# Patient Record
Sex: Female | Born: 1990 | Hispanic: No | Marital: Single | Smoking: Never smoker
Health system: Southern US, Community
[De-identification: ages and names within clinical notes are randomized; demographics above are authoritative.]

## PROBLEM LIST (undated history)

## (undated) DIAGNOSIS — F419 Anxiety disorder, unspecified: Secondary | ICD-10-CM

---

## 2012-04-28 ENCOUNTER — Emergency Department (HOSPITAL_COMMUNITY): Payer: Self-pay

## 2012-04-28 ENCOUNTER — Emergency Department (HOSPITAL_COMMUNITY)
Admission: EM | Admit: 2012-04-28 | Discharge: 2012-04-28 | Disposition: A | Discharge status: Home / Self Care | Payer: Self-pay | Attending: Emergency Medicine | Admitting: Emergency Medicine

## 2012-04-28 ENCOUNTER — Encounter (HOSPITAL_COMMUNITY): Payer: Self-pay | Admitting: Emergency Medicine

## 2012-04-28 DIAGNOSIS — Z8659 Personal history of other mental and behavioral disorders: Secondary | ICD-10-CM | POA: Insufficient documentation

## 2012-04-28 DIAGNOSIS — R112 Nausea with vomiting, unspecified: Secondary | ICD-10-CM

## 2012-04-28 DIAGNOSIS — R059 Cough, unspecified: Secondary | ICD-10-CM | POA: Insufficient documentation

## 2012-04-28 DIAGNOSIS — Z3490 Encounter for supervision of normal pregnancy, unspecified, unspecified trimester: Secondary | ICD-10-CM

## 2012-04-28 DIAGNOSIS — R0602 Shortness of breath: Secondary | ICD-10-CM | POA: Insufficient documentation

## 2012-04-28 DIAGNOSIS — R0789 Other chest pain: Secondary | ICD-10-CM | POA: Insufficient documentation

## 2012-04-28 DIAGNOSIS — O219 Vomiting of pregnancy, unspecified: Secondary | ICD-10-CM | POA: Insufficient documentation

## 2012-04-28 DIAGNOSIS — R05 Cough: Secondary | ICD-10-CM

## 2012-04-28 HISTORY — DX: Anxiety disorder, unspecified: F41.9

## 2012-04-28 LAB — URINALYSIS, ROUTINE W REFLEX MICROSCOPIC
Bilirubin Urine: NEGATIVE
Nitrite: NEGATIVE
Specific Gravity, Urine: 1.015 (ref 1.005–1.030)
Urobilinogen, UA: 0.2 mg/dL (ref 0.0–1.0)
pH: 8 (ref 5.0–8.0)

## 2012-04-28 LAB — CBC WITH DIFFERENTIAL/PLATELET
Eosinophils Absolute: 0.2 10*3/uL (ref 0.0–0.7)
Eosinophils Relative: 2 % (ref 0–5)
HCT: 41.9 % (ref 36.0–46.0)
Lymphocytes Relative: 12 % (ref 12–46)
Lymphs Abs: 1.5 10*3/uL (ref 0.7–4.0)
MCH: 28.9 pg (ref 26.0–34.0)
MCV: 83.6 fL (ref 78.0–100.0)
Monocytes Absolute: 0.9 10*3/uL (ref 0.1–1.0)
Monocytes Relative: 7 % (ref 3–12)
Platelets: 276 10*3/uL (ref 150–400)
RBC: 5.01 MIL/uL (ref 3.87–5.11)
WBC: 13.1 10*3/uL — ABNORMAL HIGH (ref 4.0–10.5)

## 2012-04-28 LAB — D-DIMER, QUANTITATIVE: D-Dimer, Quant: 0.31 ug/mL-FEU (ref 0.00–0.48)

## 2012-04-28 LAB — URINE MICROSCOPIC-ADD ON

## 2012-04-28 LAB — COMPREHENSIVE METABOLIC PANEL
ALT: 9 U/L (ref 0–35)
BUN: 6 mg/dL (ref 6–23)
CO2: 26 mEq/L (ref 19–32)
Calcium: 10 mg/dL (ref 8.4–10.5)
Creatinine, Ser: 0.45 mg/dL — ABNORMAL LOW (ref 0.50–1.10)
GFR calc Af Amer: >90 mL/min (ref >90)
GFR calc non Af Amer: >90 mL/min (ref >90)
Glucose, Bld: 84 mg/dL (ref 70–99)
Total Protein: 8.3 g/dL (ref 6.0–8.3)

## 2012-04-28 LAB — POCT I-STAT TROPONIN I

## 2012-04-28 MED ORDER — ALBUTEROL SULFATE (5 MG/ML) 0.5% IN NEBU
5.0000 mg | INHALATION_SOLUTION | Freq: Once | RESPIRATORY_TRACT | Status: AC
  Administered 2012-04-28: 5 mg via RESPIRATORY_TRACT
  Filled 2012-04-28: qty 1

## 2012-04-28 MED ORDER — ALBUTEROL SULFATE HFA 108 (90 BASE) MCG/ACT IN AERS
2.0000 | INHALATION_SPRAY | Freq: Once | RESPIRATORY_TRACT | Status: AC
  Administered 2012-04-28: 2 via RESPIRATORY_TRACT
  Filled 2012-04-28: qty 6.7

## 2012-04-28 MED ORDER — ONDANSETRON 4 MG PO TBDP
4.0000 mg | ORAL_TABLET | Freq: Once | ORAL | Status: AC
  Administered 2012-04-28: 4 mg via ORAL
  Filled 2012-04-28: qty 1

## 2012-04-28 MED ORDER — CONCEPT OB 130-92.4-1 MG PO CAPS: 1.0000 | ORAL_CAPSULE | Freq: Every day | ORAL | Status: DC

## 2012-04-28 MED ORDER — ONDANSETRON HCL 4 MG PO TABS: 4.0000 mg | ORAL_TABLET | Freq: Four times a day (QID) | ORAL | Status: DC

## 2012-04-28 NOTE — ED Notes (Signed)
Chest pain  midsternal heavy x  3-4 days some sob vomiting a lot had some blood in it has not seen a dr for her preg she states she is 9- 10 weeks

## 2012-04-28 NOTE — ED Provider Notes (Signed)
History     CSN: [id number]  Arrival date & time 04/28/12  1149   First MD Initiated Contact with Patient 04/28/12 1433      No chief complaint on file.   (Consider location/radiation/quality/duration/timing/severity/associated sxs/prior treatment) The history is provided by the patient and medical records.    Morgan Schneider is a 21 y.o. female presents to the emergency department c/o CP, SOB.  Pt states she is [redacted] weeks pregnant, has had no prenatal care and is not taking a prenatal vitamin.  Patient states the symptoms began gradually, but persistent progressively worsening. She is complaining of chest pain described as heavy like pressure, located in the midsternal region, rated at a 4/10, nonradiating. Patient has associated shortness of breath, cough and vomiting. She states that she has been vomiting gastric contents and one episode was bloody but has otherwise been nonbilious and nonbloody. Nothing makes the chest pain better and nothing makes the chest pain worse. She has tried nothing for the pain. She states she has generally kept her nausea under control until she tries to eat. She denies coryza, sinus pressure, sore throat, mucus production with her cough, hemoptysis, diarrhea, weakness, dizziness, syncope.   Past Medical History  Diagnosis Date  . Anxiety     No past surgical history on file.  No family history on file.  History  Substance Use Topics  . Smoking status: Never Smoker   . Smokeless tobacco: Not on file  . Alcohol Use: No    OB History    Grav Para Term Preterm Abortions TAB SAB Ect Mult Living   1               Review of Systems  Constitutional: Negative for fever, diaphoresis, appetite change, fatigue and unexpected weight change.  HENT: Negative for congestion, sore throat, rhinorrhea, mouth sores, neck pain, neck stiffness, postnasal drip and sinus pressure.   Eyes: Negative for visual disturbance.  Respiratory: Positive for cough and shortness  of breath. Negative for chest tightness and wheezing.   Cardiovascular: Positive for chest pain.  Gastrointestinal: Positive for nausea and vomiting. Negative for abdominal pain, diarrhea and constipation.  Genitourinary: Negative for dysuria, urgency, frequency and hematuria.  Musculoskeletal: Negative for back pain and arthralgias.  Skin: Negative for rash.  Neurological: Negative for syncope, light-headedness and headaches.  Psychiatric/Behavioral: Negative for sleep disturbance. The patient is not nervous/anxious.   All other systems reviewed and are negative.    Allergies  Review of patient's allergies indicates no known allergies.  Home Medications   Current Outpatient Rx  Name  Route  Sig  Dispense  Refill  . ONDANSETRON HCL 4 MG PO TABS   Oral   Take 1 tablet (4 mg total) by mouth every 6 (six) hours.   12 tablet   0   . CONCEPT OB 130-92.4-1 MG PO CAPS   Oral   Take 1 capsule by mouth daily.   30 capsule   12     BP 123/88  Pulse 96  Temp 97.9 F (36.6 C) (Oral)  Resp 16  SpO2 100%  Physical Exam  Nursing note and vitals reviewed. Constitutional: She is oriented to person, place, and time. She appears well-developed and well-nourished. No distress.  HENT:  Head: Normocephalic and atraumatic.  Mouth/Throat: Oropharynx is clear and moist. No oropharyngeal exudate.  Eyes: Conjunctivae normal and EOM are normal. Pupils are equal, round, and reactive to light. No scleral icterus.  Neck: Normal range of motion. Neck supple.  Cardiovascular: Regular rhythm and intact distal pulses.  Tachycardia present.   Murmur heard.  Systolic murmur is present with a grade of 3/6  Pulses:      Carotid pulses are 2+ on the right side, and 2+ on the left side.      Radial pulses are 2+ on the right side, and 2+ on the left side.       Dorsalis pedis pulses are 2+ on the right side, and 2+ on the left side.       Posterior tibial pulses are 2+ on the right side, and 2+ on the  left side.  Pulmonary/Chest: Effort normal and breath sounds normal. No respiratory distress. She has no wheezes. She has no rales. She exhibits no tenderness.  Abdominal: Soft. Bowel sounds are normal. She exhibits no distension and no mass. There is no tenderness. There is no rebound and no guarding.  Musculoskeletal: Normal range of motion. She exhibits no edema.  Lymphadenopathy:    She has no cervical adenopathy.  Neurological: She is alert and oriented to person, place, and time. She exhibits normal muscle tone. Coordination normal.       Speech is clear and goal oriented Moves extremities without ataxia  Skin: Skin is warm and dry. No rash noted. She is not diaphoretic.  Psychiatric: She has a normal mood and affect.    ED Course  Procedures (including critical care time)  Labs Reviewed  CBC WITH DIFFERENTIAL - Abnormal; Notable for the following:    WBC 13.1 (*)     Neutrophils Relative 80 (*)     Neutro Abs 10.4 (*)     All other components within normal limits  COMPREHENSIVE METABOLIC PANEL - Abnormal; Notable for the following:    Creatinine, Ser 0.45 (*)     All other components within normal limits  URINALYSIS, ROUTINE W REFLEX MICROSCOPIC - Abnormal; Notable for the following:    APPearance CLOUDY (*)     Ketones, ur 40 (*)     Leukocytes, UA MODERATE (*)     All other components within normal limits  URINE MICROSCOPIC-ADD ON - Abnormal; Notable for the following:    Squamous Epithelial / LPF MANY (*)     Bacteria, UA MANY (*)     All other components within normal limits  POCT I-STAT TROPONIN I  D-DIMER, QUANTITATIVE  URINE CULTURE   Dg Chest 2 View  04/28/2012  *RADIOLOGY REPORT*  Clinical Data: Cough, shortness of breath.  CHEST - 2 VIEW  Comparison: None.  Findings: Cardiomediastinal silhouette appears normal.  No acute pulmonary disease is noted.  Bony thorax is intact.  IMPRESSION: No acute cardiopulmonary abnormality seen.   Original Report Authenticated  By: Lupita Raider.,  M.D.    Results for orders placed during the hospital encounter of 04/28/12  CBC WITH DIFFERENTIAL      Component Value Range   WBC 13.1 (*) 4.0 - 10.5 K/uL   RBC 5.01  3.87 - 5.11 MIL/uL   Hemoglobin 14.5  12.0 - 15.0 g/dL   HCT 16.1  09.6 - 04.5 %   MCV 83.6  78.0 - 100.0 fL   MCH 28.9  26.0 - 34.0 pg   MCHC 34.6  30.0 - 36.0 g/dL   RDW 40.9  81.1 - 91.4 %   Platelets 276  150 - 400 K/uL   Neutrophils Relative 80 (*) 43 - 77 %   Neutro Abs 10.4 (*) 1.7 - 7.7 K/uL   Lymphocytes  Relative 12  12 - 46 %   Lymphs Abs 1.5  0.7 - 4.0 K/uL   Monocytes Relative 7  3 - 12 %   Monocytes Absolute 0.9  0.1 - 1.0 K/uL   Eosinophils Relative 2  0 - 5 %   Eosinophils Absolute 0.2  0.0 - 0.7 K/uL   Basophils Relative 0  0 - 1 %   Basophils Absolute 0.0  0.0 - 0.1 K/uL  COMPREHENSIVE METABOLIC PANEL      Component Value Range   Sodium 137  135 - 145 mEq/L   Potassium 4.3  3.5 - 5.1 mEq/L   Chloride 99  96 - 112 mEq/L   CO2 26  19 - 32 mEq/L   Glucose, Bld 84  70 - 99 mg/dL   BUN 6  6 - 23 mg/dL   Creatinine, Ser 8.65 (*) 0.50 - 1.10 mg/dL   Calcium 78.4  8.4 - 69.6 mg/dL   Total Protein 8.3  6.0 - 8.3 g/dL   Albumin 4.6  3.5 - 5.2 g/dL   AST 16  0 - 37 U/L   ALT 9  0 - 35 U/L   Alkaline Phosphatase 44  39 - 117 U/L   Total Bilirubin 0.4  0.3 - 1.2 mg/dL   GFR calc non Af Amer >90  >90 mL/min   GFR calc Af Amer >90  >90 mL/min  URINALYSIS, ROUTINE W REFLEX MICROSCOPIC      Component Value Range   Color, Urine YELLOW  YELLOW   APPearance CLOUDY (*) CLEAR   Specific Gravity, Urine 1.015  1.005 - 1.030   pH 8.0  5.0 - 8.0   Glucose, UA NEGATIVE  NEGATIVE mg/dL   Hgb urine dipstick NEGATIVE  NEGATIVE   Bilirubin Urine NEGATIVE  NEGATIVE   Ketones, ur 40 (*) NEGATIVE mg/dL   Protein, ur NEGATIVE  NEGATIVE mg/dL   Urobilinogen, UA 0.2  0.0 - 1.0 mg/dL   Nitrite NEGATIVE  NEGATIVE   Leukocytes, UA MODERATE (*) NEGATIVE  POCT I-STAT TROPONIN I      Component  Value Range   Troponin i, poc 0.00  0.00 - 0.08 ng/mL   Comment 3           URINE MICROSCOPIC-ADD ON      Component Value Range   Squamous Epithelial / LPF MANY (*) RARE   WBC, UA 3-6  <3 WBC/hpf   Bacteria, UA MANY (*) RARE   Urine-Other AMORPHOUS URATES/PHOSPHATES    D-DIMER, QUANTITATIVE      Component Value Range   D-Dimer, Quant 0.31  0.00 - 0.48 ug/mL-FEU   Dg Chest 2 View  04/28/2012  *RADIOLOGY REPORT*  Clinical Data: Cough, shortness of breath.  CHEST - 2 VIEW  Comparison: None.  Findings: Cardiomediastinal silhouette appears normal.  No acute pulmonary disease is noted.  Bony thorax is intact.  IMPRESSION: No acute cardiopulmonary abnormality seen.   Original Report Authenticated By: Lupita Raider.,  M.D.       1. Nausea & vomiting   2. Pregnancy   3. Cough       MDM  Guida Asman presents to the emergency department complaining of chest pain and shortness of breath.  She is [redacted] weeks pregnant.  Urine with contamination, urine culture sent, patient without dysuria or other urinary symptoms.  CMP unremarkable, troponin negative, CBC with mild leukocytosis.  Concern for her bronchitis versus pneumonia versus PE. D-dimer negative, chest x-ray without evidence of effusion or  infiltrate.  Patient given albuterol treatment with significant improvement in chest pain and shortness of breath.  I discussed these findings with the patient. I also discussed the importance of followup and prenatal care. Patient will be prescribed a when necessary inhaler, Zofran for nausea and prenatal vitamins.  I have also discussed reasons to return immediately to the ER.  Patient expresses understanding and agrees with plan.   1. Medications: Albuterol inhaler, prenatal vitamins, Zofran  2. Treatment: Rest, drink plenty of fluids, use inhaler when you feel short of breath, take Zofran when you feel nausea, take prenatal vitamins every day as prescribed  3. Follow Up: With [facility] or  [facility] as discussed with Sunny Schlein, prenatal care is very important to your health and your baby        Dierdre Forth, PA-C 04/28/12 2245

## 2012-04-28 NOTE — ED Notes (Signed)
Patient states she has a dry cough that causes her to acid reflux. She has sharp pains in her chest that she says makes it hard to breath. She is currently pregnant and has no OBGYN.

## 2012-04-29 LAB — URINE CULTURE
Colony Count: NO GROWTH
Culture: NO GROWTH

## 2012-04-30 NOTE — ED Provider Notes (Signed)
Medical screening examination/treatment/procedure(s) were performed by non-physician practitioner and as supervising physician I was immediately available for consultation/collaboration.   Lyanne Co, MD 04/30/12 [phone]

## 2013-09-25 IMAGING — CR DG CHEST 2V
2 series · 2 of 2 positions shown · non-contrast
Comparison: None.

CLINICAL DATA: Cough, shortness of breath.

CHEST - 2 VIEW

[w chest pa]
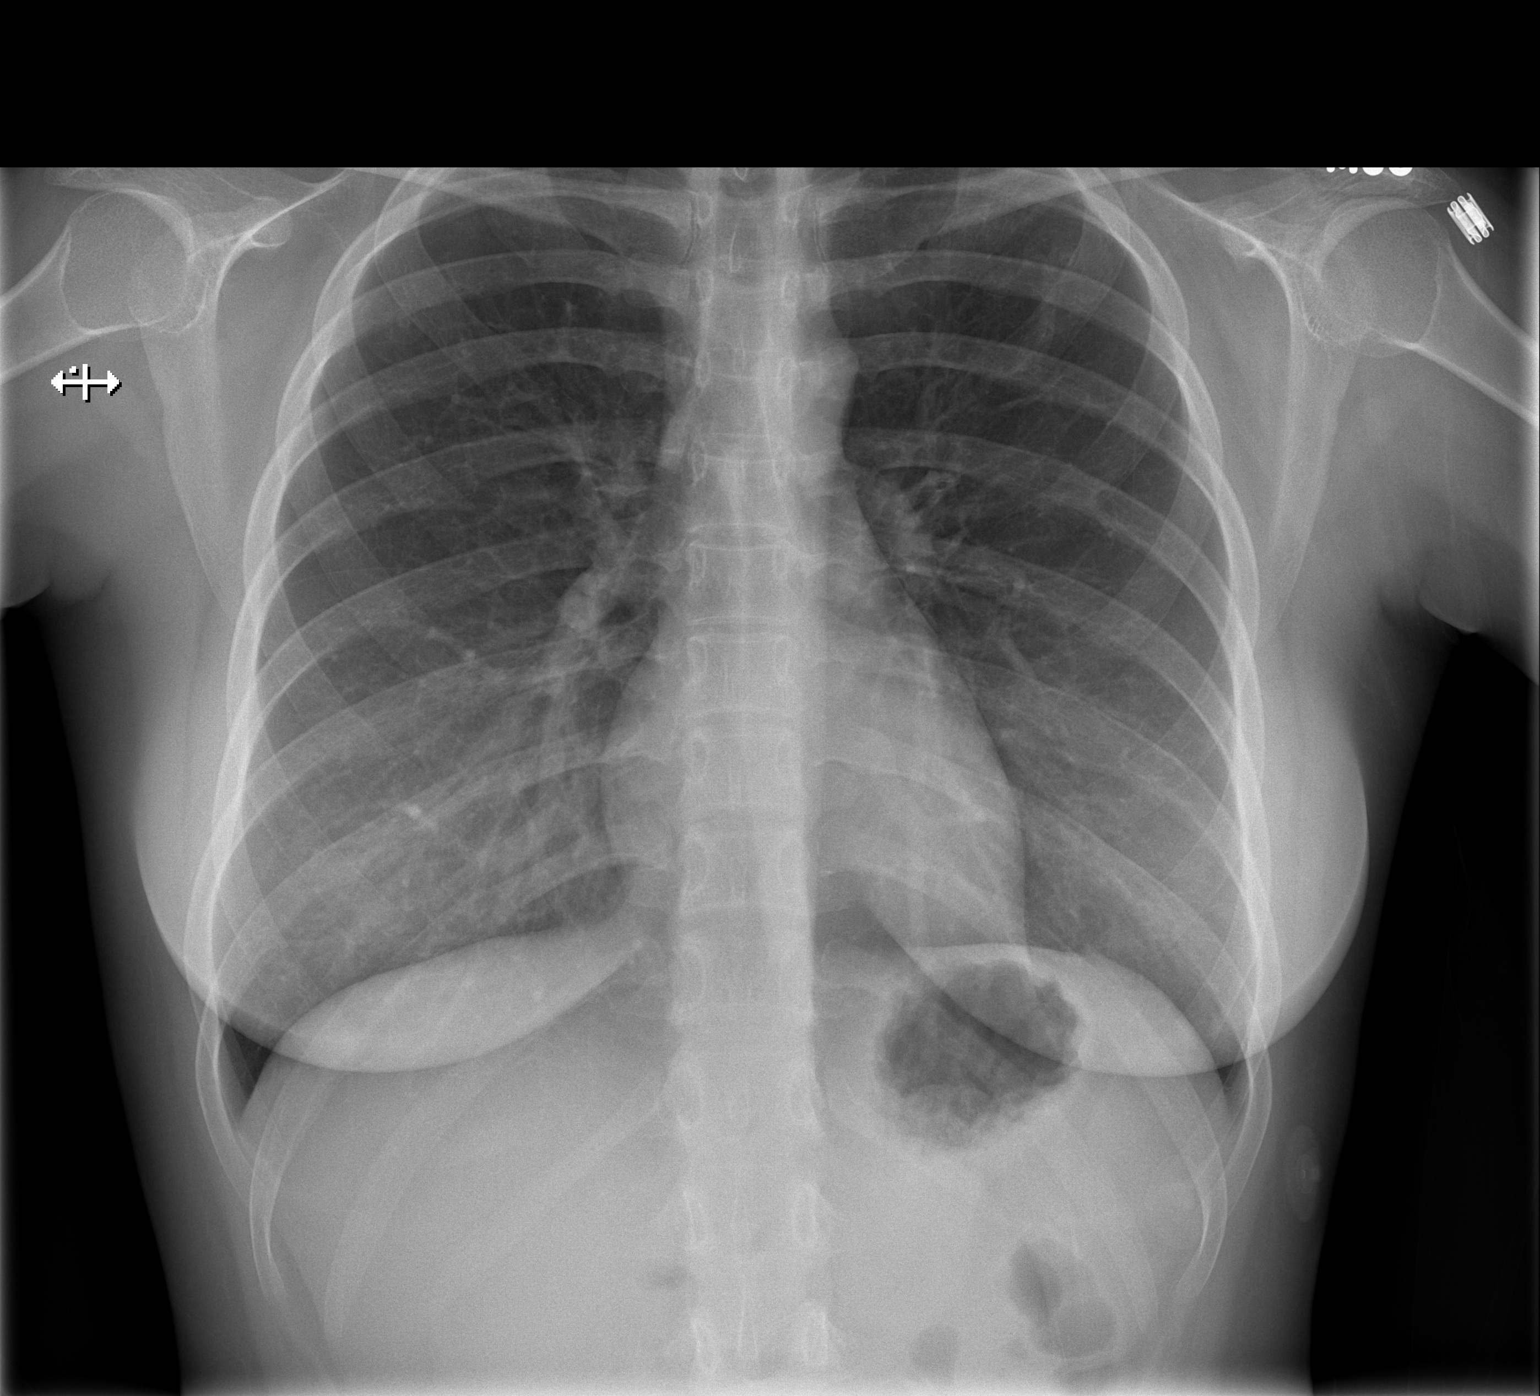

[w chest lat]
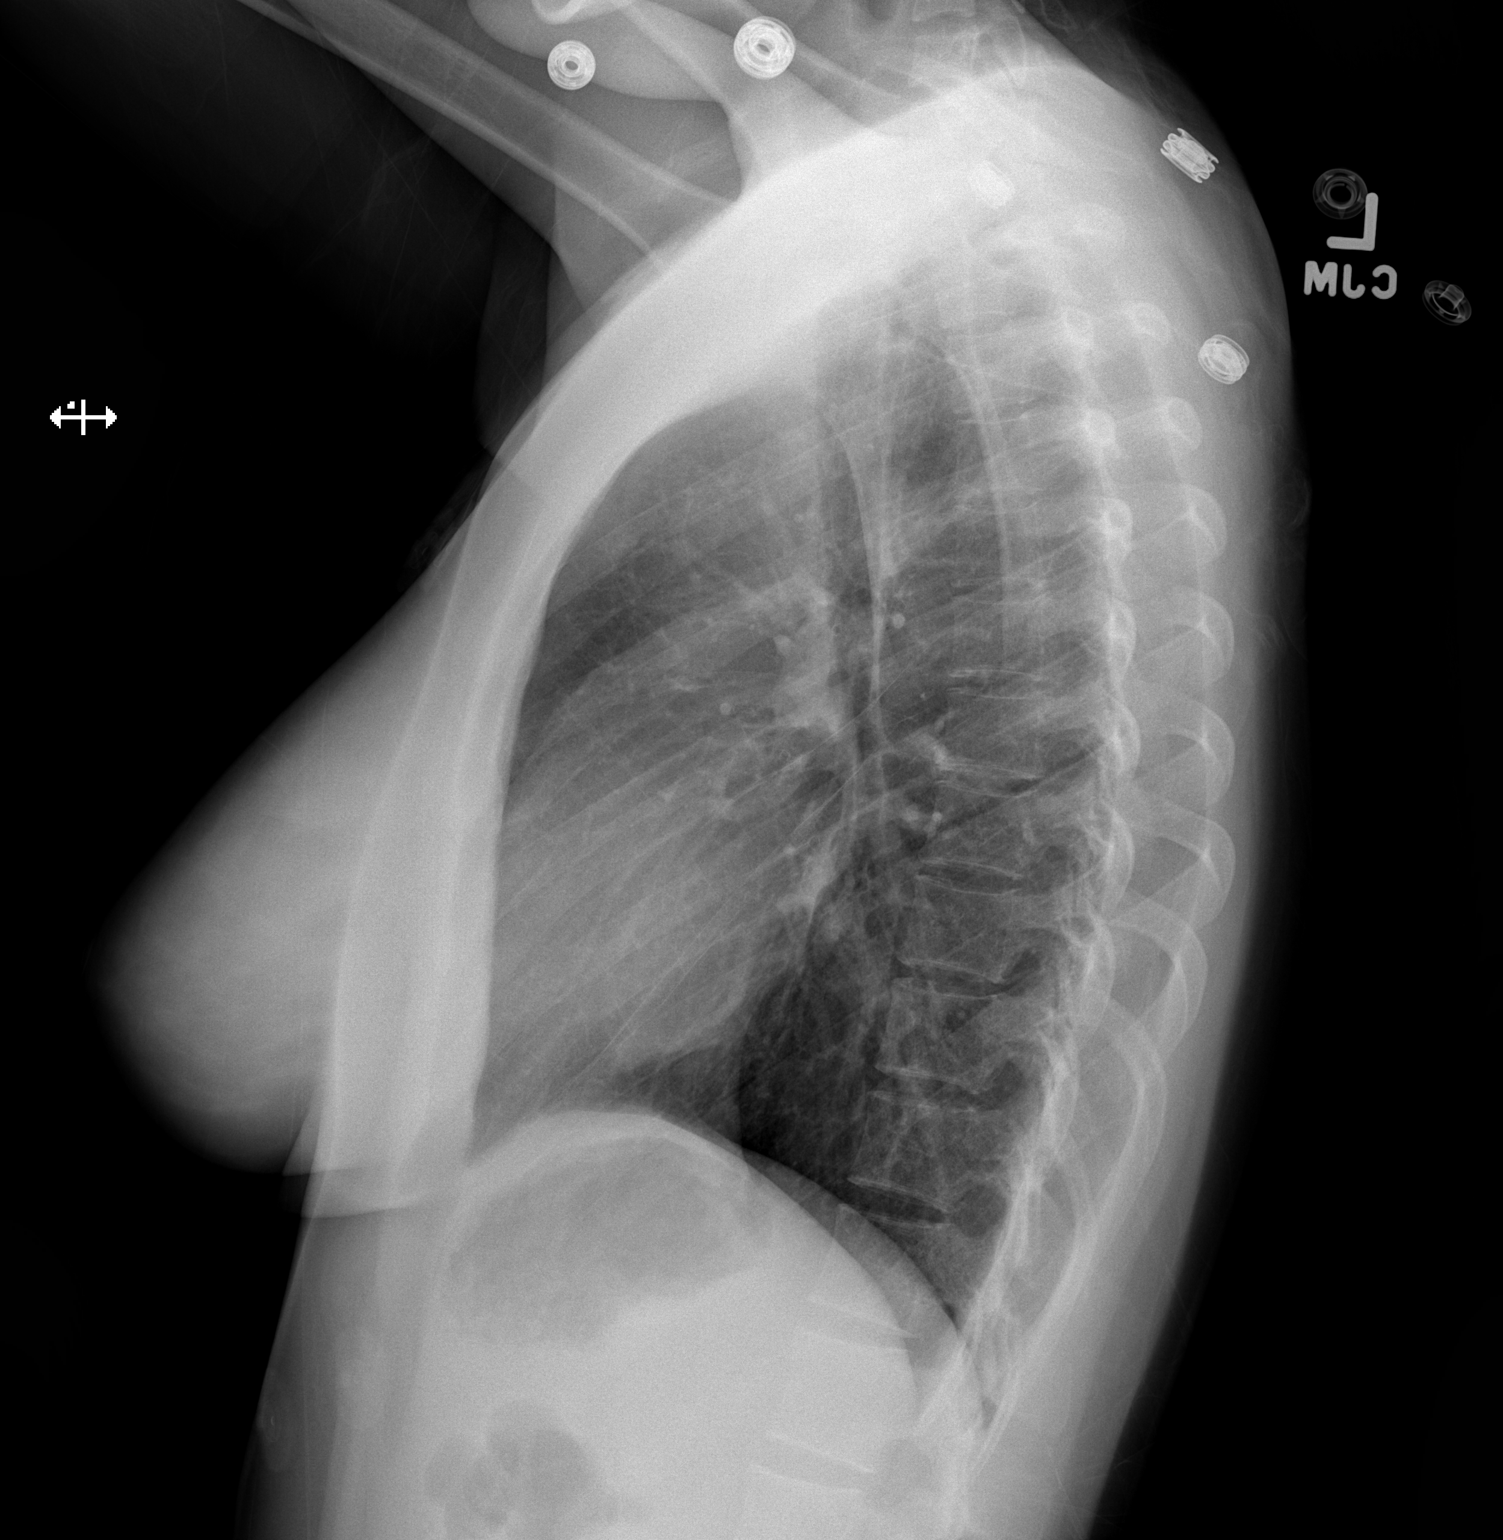

[2 of 2 positions shown; findings below may reference images not displayed]

FINDINGS: Cardiomediastinal silhouette appears normal.  No acute
pulmonary disease is noted.  Bony thorax is intact.
IMPRESSION: No acute cardiopulmonary abnormality seen.

## 2014-01-17 ENCOUNTER — Emergency Department (HOSPITAL_COMMUNITY): Payer: BC Managed Care – PPO

## 2014-01-17 ENCOUNTER — Encounter (HOSPITAL_COMMUNITY): Payer: Self-pay | Admitting: Emergency Medicine

## 2014-01-17 ENCOUNTER — Emergency Department (HOSPITAL_COMMUNITY)
Admission: EM | Admit: 2014-01-17 | Discharge: 2014-01-17 | Disposition: A | Discharge status: Home / Self Care | Payer: BC Managed Care – PPO | Attending: Emergency Medicine | Admitting: Emergency Medicine

## 2014-01-17 DIAGNOSIS — Z8659 Personal history of other mental and behavioral disorders: Secondary | ICD-10-CM | POA: Diagnosis not present

## 2014-01-17 DIAGNOSIS — R1032 Left lower quadrant pain: Secondary | ICD-10-CM | POA: Insufficient documentation

## 2014-01-17 DIAGNOSIS — Z791 Long term (current) use of non-steroidal anti-inflammatories (NSAID): Secondary | ICD-10-CM | POA: Insufficient documentation

## 2014-01-17 DIAGNOSIS — Z3202 Encounter for pregnancy test, result negative: Secondary | ICD-10-CM | POA: Insufficient documentation

## 2014-01-17 DIAGNOSIS — N739 Female pelvic inflammatory disease, unspecified: Secondary | ICD-10-CM | POA: Insufficient documentation

## 2014-01-17 DIAGNOSIS — N730 Acute parametritis and pelvic cellulitis: Secondary | ICD-10-CM

## 2014-01-17 DIAGNOSIS — Z79899 Other long term (current) drug therapy: Secondary | ICD-10-CM | POA: Insufficient documentation

## 2014-01-17 LAB — CBC WITH DIFFERENTIAL/PLATELET
Basophils Absolute: 0 10*3/uL (ref 0.0–0.1)
Basophils Relative: 0 % (ref 0–1)
EOS ABS: 0.4 10*3/uL (ref 0.0–0.7)
Eosinophils Relative: 4 % (ref 0–5)
HCT: 37 % (ref 36.0–46.0)
HEMOGLOBIN: 11.7 g/dL — AB (ref 12.0–15.0)
LYMPHS ABS: 1.8 10*3/uL (ref 0.7–4.0)
LYMPHS PCT: 20 % (ref 12–46)
MCH: 23.7 pg — AB (ref 26.0–34.0)
MCHC: 31.6 g/dL (ref 30.0–36.0)
MCV: 75.1 fL — ABNORMAL LOW (ref 78.0–100.0)
MONOS PCT: 6 % (ref 3–12)
Monocytes Absolute: 0.5 10*3/uL (ref 0.1–1.0)
NEUTROS ABS: 6.3 10*3/uL (ref 1.7–7.7)
NEUTROS PCT: 70 % (ref 43–77)
Platelets: 332 10*3/uL (ref 150–400)
RBC: 4.93 MIL/uL (ref 3.87–5.11)
RDW: 14.2 % (ref 11.5–15.5)
WBC: 9 10*3/uL (ref 4.0–10.5)

## 2014-01-17 LAB — COMPREHENSIVE METABOLIC PANEL
ALK PHOS: 50 U/L (ref 39–117)
ALT: 9 U/L (ref 0–35)
ANION GAP: 14 (ref 5–15)
AST: 18 U/L (ref 0–37)
Albumin: 4.4 g/dL (ref 3.5–5.2)
BILIRUBIN TOTAL: 0.3 mg/dL (ref 0.3–1.2)
BUN: 10 mg/dL (ref 6–23)
CHLORIDE: 101 meq/L (ref 96–112)
CO2: 22 meq/L (ref 19–32)
Calcium: 9.5 mg/dL (ref 8.4–10.5)
Creatinine, Ser: 0.56 mg/dL (ref 0.50–1.10)
GLUCOSE: 91 mg/dL (ref 70–99)
POTASSIUM: 3.8 meq/L (ref 3.7–5.3)
Sodium: 137 mEq/L (ref 137–147)
Total Protein: 8.1 g/dL (ref 6.0–8.3)

## 2014-01-17 LAB — URINALYSIS, ROUTINE W REFLEX MICROSCOPIC
BILIRUBIN URINE: NEGATIVE
Glucose, UA: NEGATIVE mg/dL
HGB URINE DIPSTICK: NEGATIVE
KETONES UR: 15 mg/dL — AB
NITRITE: NEGATIVE
Protein, ur: NEGATIVE mg/dL
Specific Gravity, Urine: 1.008 (ref 1.005–1.030)
UROBILINOGEN UA: 0.2 mg/dL (ref 0.0–1.0)
pH: 5.5 (ref 5.0–8.0)

## 2014-01-17 LAB — URINE MICROSCOPIC-ADD ON

## 2014-01-17 LAB — POC URINE PREG, ED: Preg Test, Ur: NEGATIVE

## 2014-01-17 LAB — WET PREP, GENITAL
TRICH WET PREP: NONE SEEN
WBC WET PREP: NONE SEEN
Yeast Wet Prep HPF POC: NONE SEEN

## 2014-01-17 MED ORDER — DOXYCYCLINE HYCLATE 100 MG PO CAPS: 100.0000 mg | ORAL_CAPSULE | Freq: Two times a day (BID) | ORAL | Status: AC

## 2014-01-17 MED ORDER — METRONIDAZOLE 500 MG PO TABS: 500.0000 mg | ORAL_TABLET | Freq: Two times a day (BID) | ORAL | Status: AC

## 2014-01-17 MED ORDER — SODIUM CHLORIDE 0.9 % IV BOLUS (SEPSIS)
1000.0000 mL | Freq: Once | INTRAVENOUS | Status: AC
  Administered 2014-01-17: 1000 mL via INTRAVENOUS

## 2014-01-17 MED ORDER — STERILE WATER FOR INJECTION IJ SOLN
INTRAMUSCULAR | Status: AC
  Administered 2014-01-17: 1 mL
  Filled 2014-01-17: qty 10

## 2014-01-17 MED ORDER — DOXYCYCLINE HYCLATE 100 MG PO TABS
100.0000 mg | ORAL_TABLET | Freq: Once | ORAL | Status: AC
  Administered 2014-01-17: 100 mg via ORAL
  Filled 2014-01-17: qty 1

## 2014-01-17 MED ORDER — DOXYCYCLINE HYCLATE 100 MG PO CAPS
100.0000 mg | ORAL_CAPSULE | Freq: Two times a day (BID) | ORAL | Status: DC
Start: 2014-01-17 — End: 2014-01-17

## 2014-01-17 MED ORDER — CEFTRIAXONE SODIUM 250 MG IJ SOLR
250.0000 mg | Freq: Once | INTRAMUSCULAR | Status: AC
  Administered 2014-01-17: 250 mg via INTRAMUSCULAR
  Filled 2014-01-17: qty 250

## 2014-01-17 NOTE — ED Provider Notes (Signed)
CSN: [id number]     Arrival date & time 01/17/14  1400 History   First MD Initiated Contact with Patient 01/17/14 1457     Chief Complaint  Patient presents with  . Abdominal Pain     (Consider location/radiation/quality/duration/timing/severity/associated sxs/prior Treatment) Patient is a 23 y.o. female presenting with abdominal pain.  Abdominal Pain Pain location:  LLQ Pain quality: aching   Pain radiates to:  Does not radiate Pain severity:  Moderate Onset quality:  Gradual Duration:  1 day Timing:  Constant Progression:  Worsening Chronicity:  New Context: not diet changes and not previous surgeries   Relieved by:  Nothing Worsened by:  Palpation, movement and deep breathing Ineffective treatments:  None tried Associated symptoms: no anorexia, no chest pain, no chills, no constipation, no cough, no diarrhea, no dysuria, no fever, no hematuria, no nausea, no shortness of breath, no sore throat, no vaginal bleeding, no vaginal discharge and no vomiting     Past Medical History  Diagnosis Date  . Anxiety    History reviewed. No pertinent past surgical history. History reviewed. No pertinent family history. History  Substance Use Topics  . Smoking status: Never Smoker   . Smokeless tobacco: Not on file  . Alcohol Use: No   OB History   Grav Para Term Preterm Abortions TAB SAB Ect Mult Living   1              Review of Systems  Constitutional: Negative for fever and chills.  HENT: Negative for congestion, rhinorrhea and sore throat.   Eyes: Negative for photophobia and visual disturbance.  Respiratory: Negative for cough and shortness of breath.   Cardiovascular: Negative for chest pain and leg swelling.  Gastrointestinal: Positive for abdominal pain. Negative for nausea, vomiting, diarrhea, constipation and anorexia.  Endocrine: Negative for polyphagia and polyuria.  Genitourinary: Negative for dysuria, hematuria, flank pain, vaginal bleeding, vaginal discharge  and enuresis.  Musculoskeletal: Negative for back pain and gait problem.  Skin: Negative for color change and rash.  Neurological: Negative for dizziness, syncope, light-headedness and numbness.  Hematological: Negative for adenopathy. Does not bruise/bleed easily.  All other systems reviewed and are negative.     Allergies  Review of patient's allergies indicates no known allergies.  Home Medications   Prior to Admission medications   Medication Sig Start Date End Date Taking? Authorizing Provider  ibuprofen (ADVIL,MOTRIN) 200 MG tablet Take 400 mg by mouth every 6 (six) hours as needed for moderate pain.   Yes Historical Provider, MD  Pseudoeph-Doxylamine-DM-APAP (NYQUIL PO) Take 2 tablets by mouth 2 (two) times daily as needed (flu like symptoms).   Yes Historical Provider, MD  doxycycline (VIBRAMYCIN) 100 MG capsule Take 1 capsule (100 mg total) by mouth 2 (two) times daily. 01/17/14   Mirian MoMatthew Gentry, MD  metroNIDAZOLE (FLAGYL) 500 MG tablet Take 1 tablet (500 mg total) by mouth 2 (two) times daily. 01/17/14   Mirian MoMatthew Gentry, MD   BP 113/73  Pulse 107  Temp(Src) 98.8 F (37.1 C) (Oral)  Resp 14  SpO2 100%  LMP 12/20/2013  Breastfeeding? Unknown Physical Exam  Vitals reviewed. Constitutional: She is oriented to person, place, and time. She appears well-developed and well-nourished.  HENT:  Head: Normocephalic and atraumatic.  Right Ear: External ear normal.  Left Ear: External ear normal.  Eyes: Conjunctivae and EOM are normal. Pupils are equal, round, and reactive to light.  Neck: Normal range of motion. Neck supple.  Cardiovascular: Normal rate, regular rhythm, normal heart  sounds and intact distal pulses.   Pulmonary/Chest: Effort normal and breath sounds normal.  Abdominal: Soft. Bowel sounds are normal. There is tenderness in the left lower quadrant.  Genitourinary: Cervix exhibits discharge (purulent) and friability. Cervix exhibits no motion tenderness. Right adnexum  displays no mass, no tenderness and no fullness. Left adnexum displays tenderness. Left adnexum displays no mass and no fullness.  Musculoskeletal: Normal range of motion.  Neurological: She is alert and oriented to person, place, and time.  Skin: Skin is warm and dry.    ED Course  Procedures (including critical care time) Labs Review Labs Reviewed  WET PREP, GENITAL - Abnormal; Notable for the following:    Clue Cells Wet Prep HPF POC FEW (*)    All other components within normal limits  CBC WITH DIFFERENTIAL - Abnormal; Notable for the following:    Hemoglobin 11.7 (*)    MCV 75.1 (*)    MCH 23.7 (*)    All other components within normal limits  URINALYSIS, ROUTINE W REFLEX MICROSCOPIC - Abnormal; Notable for the following:    Ketones, ur 15 (*)    Leukocytes, UA SMALL (*)    All other components within normal limits  GC/CHLAMYDIA PROBE AMP  COMPREHENSIVE METABOLIC PANEL  URINE MICROSCOPIC-ADD ON  POC URINE PREG, ED    Imaging Review US Transvaginal Non-ob  01/17/2014   CLINICAL DATA:  23 year old female with left pelvic pain. Negative pregnancy test.  EXAM: TRANSABDOMINAL AND TRANSVAGINAL ULTRASOUND OF PELVIS  TECHNIQUE: Both transabdominal and transvaginal ultrasound examinations of the pelvis were performed. Transabdominal technique was performed for global imaging of the pelvis including uterus, ovaries, adnexal regions, and pelvic cul-de-sac. It was necessary to proceed with endovaginal exam following the transabdominal exam to visualize the ovaries and endometrium.  COMPARISON:  None  FINDINGS: Uterus  Measurements: 7.4 x 4.3 x 5.1 cm. No fibroids or other mass visualized.  Endometrium  Thickness: 18 mm.  No focal abnormality visualized.  Right ovary  Measurements: 3.4 x 2.3 x 2.2 cm. Normal appearance/no adnexal mass.  Left ovary  Measurements: 3.6 x 2 x 2.2 cm. Normal appearance/no adnexal mass.  Other findings  A small amount free pelvic fluid is noted and may be  physiologic. There is no evidence of adnexal mass.  IMPRESSION: Mildly thickened endometrium measuring 18 mm. Consider follow-up by [place] in 6-8 weeks, during the week immediately following menses (exam timing is critical).  Small amount of free pelvic fluid which may be physiologic.  No other significant abnormalities identified.   Electronically Signed   By: Laveda Abbe M.D.   On: 01/17/2014 16:55   US Pelvis Complete  01/17/2014   CLINICAL DATA:  23 year old female with left pelvic pain. Negative pregnancy test.  EXAM: TRANSABDOMINAL AND TRANSVAGINAL ULTRASOUND OF PELVIS  TECHNIQUE: Both transabdominal and transvaginal ultrasound examinations of the pelvis were performed. Transabdominal technique was performed for global imaging of the pelvis including uterus, ovaries, adnexal regions, and pelvic cul-de-sac. It was necessary to proceed with endovaginal exam following the transabdominal exam to visualize the ovaries and endometrium.  COMPARISON:  None  FINDINGS: Uterus  Measurements: 7.4 x 4.3 x 5.1 cm. No fibroids or other mass visualized.  Endometrium  Thickness: 18 mm.  No focal abnormality visualized.  Right ovary  Measurements: 3.4 x 2.3 x 2.2 cm. Normal appearance/no adnexal mass.  Left ovary  Measurements: 3.6 x 2 x 2.2 cm. Normal appearance/no adnexal mass.  Other findings  A small amount free pelvic fluid is  noted and may be physiologic. There is no evidence of adnexal mass.  IMPRESSION: Mildly thickened endometrium measuring 18 mm. Consider follow-up by [place] in 6-8 weeks, during the week immediately following menses (exam timing is critical).  Small amount of free pelvic fluid which may be physiologic.  No other significant abnormalities identified.   Electronically Signed   By: Laveda Abbe M.D.   On: 01/17/2014 16:55     EKG Interpretation None      MDM   Final diagnoses:  PID (acute pelvic inflammatory disease)    23 y.o. female  without pertinent PMH presents with left lower quadrant abdominal  tenderness and pain as described above. Patient was in her normal state of health until yesterday when she began to have soreness in her left lower quadrant which became increasingly severe. She denies fever, nausea, vomiting, diarrhea, constipation, or any other illness. The patient states that eating does not affect her pain.  On arrival vital signs as above significant for mild tachycardia, which improved during my examination.  Physical exam demonstrated purulent discharge from cervix and cervical friability.  Ultrasound obtained and as above unremarkable for abscess.  Will treat patient for PID and have her followup with her primary care physician.  Standard return precautions for torsion, other abdominal symptoms given, patient voiced understanding and agreed to followup.     Labs and imaging as above reviewed.   1. PID (acute pelvic inflammatory disease)         Mirian Mo, MD 01/18/14 0000

## 2014-01-17 NOTE — ED Notes (Signed)
Pelvic setup at bedside.

## 2014-01-17 NOTE — Discharge Instructions (Signed)
Pelvic Inflammatory Disease °Pelvic inflammatory disease (PID) refers to an infection in some or all of the female organs. The infection can be in the uterus, ovaries, fallopian tubes, or the surrounding tissues in the pelvis. PID can cause abdominal or pelvic pain that comes on suddenly (acute pelvic pain). PID is a serious infection because it can lead to lasting (chronic) pelvic pain or the inability to have children (infertile).  °CAUSES  °The infection is often caused by the normal bacteria found in the vaginal tissues. PID may also be caused by an infection that is spread during sexual contact. PID can also occur following:  °· The birth of a baby.   °· A miscarriage.   °· An abortion.   °· Major pelvic surgery.   °· The use of an intrauterine device (IUD).   °· A sexual assault.   °RISK FACTORS °Certain factors can put a person at higher risk for PID, such as: °· Being younger than 25 years. °· Being sexually active at a young age. °· Using nonbarrier contraception. °· Having multiple sexual partners. °· Having sex with someone who has symptoms of a genital infection. °· Using oral contraception. °Other times, certain behaviors can increase the possibility of getting PID, such as: °· Having sex during your period. °· Using a vaginal douche. °· Having an intrauterine device (IUD) in place. °SYMPTOMS  °· Abdominal or pelvic pain.   °· Fever.   °· Chills.   °· Abnormal vaginal discharge. °· Abnormal uterine bleeding.   °· Unusual pain shortly after finishing your period. °DIAGNOSIS  °Your caregiver will choose some of the following methods to make a diagnosis, such as:  °· Performing a physical exam and history. A pelvic exam typically reveals a very tender uterus and surrounding pelvis.   °· Ordering laboratory tests including a pregnancy test, blood tests, and urine test.  °· Ordering cultures of the vagina and cervix to check for a sexually transmitted infection (STI). °· Performing an ultrasound.    °· Performing a laparoscopic procedure to look inside the pelvis.   °TREATMENT  °· Antibiotic medicines may be prescribed and taken by mouth.   °· Sexual partners may be treated when the infection is caused by a sexually transmitted disease (STD).   °· Hospitalization may be needed to give antibiotics intravenously. °· Surgery may be needed, but this is rare. °It may take weeks until you are completely well. If you are diagnosed with PID, you should also be checked for human immunodeficiency virus (HIV).   °HOME CARE INSTRUCTIONS  °· If given, take your antibiotics as directed. Finish the medicine even if you start to feel better.   °· Only take over-the-counter or prescription medicines for pain, discomfort, or fever as directed by your caregiver.   °· Do not have sexual intercourse until treatment is completed or as directed by your caregiver. If PID is confirmed, your recent sexual partner(s) will need treatment.   °· Keep your follow-up appointments. °SEEK MEDICAL CARE IF:  °· You have increased or abnormal vaginal discharge.   °· You need prescription medicine for your pain.   °· You vomit.   °· You cannot take your medicines.   °· Your partner has an STD.   °SEEK IMMEDIATE MEDICAL CARE IF:  °· You have a fever.   °· You have increased abdominal or pelvic pain.   °· You have chills.   °· You have pain when you urinate.   °· You are not better after 72 hours following treatment.   °MAKE SURE YOU:  °· Understand these instructions. °· Will watch your condition. °· Will get help right away if you are not doing well or get worse. °  Document Released: 05/27/2005 Document Revised: 09/21/2012 Document Reviewed: 05/23/2011 °[employer]® Patient Information ©2015 [employer]. This information is not intended to replace advice given to you by your health care provider. Make sure you discuss any questions you have with your health care provider. ° °

## 2014-01-17 NOTE — ED Notes (Signed)
Pt transported to Ultrasound.  

## 2014-01-17 NOTE — ED Notes (Signed)
Pt states llq abdominal pain since last night.  No n/v.  No diarrhea.  No fever.  No difficulty urinating.

## 2014-01-17 NOTE — ED Notes (Signed)
Per pt, was seen at [facility] this morning.  Pt had pregnancy test completed.  Results negative.

## 2014-01-18 ENCOUNTER — Telehealth (HOSPITAL_BASED_OUTPATIENT_CLINIC_OR_DEPARTMENT_OTHER): Payer: Self-pay | Admitting: Emergency Medicine

## 2014-01-18 LAB — GC/CHLAMYDIA PROBE AMP
CT Probe RNA: NEGATIVE
GC Probe RNA: NEGATIVE

## 2014-04-11 ENCOUNTER — Encounter (HOSPITAL_COMMUNITY): Payer: Self-pay | Admitting: Emergency Medicine

## 2015-06-16 IMAGING — US US PELVIS COMPLETE
1 series · 13 of 25 positions shown · non-contrast
Comparison: None

CLINICAL DATA: 22-year-old female with left pelvic pain. Negative
pregnancy test.

EXAM:
TRANSABDOMINAL AND TRANSVAGINAL ULTRASOUND OF PELVIS
TECHNIQUE: Both transabdominal and transvaginal ultrasound examinations of the
pelvis were performed. Transabdominal technique was performed for
global imaging of the pelvis including uterus, ovaries, adnexal
regions, and pelvic cul-de-sac. It was necessary to proceed with
endovaginal exam following the transabdominal exam to visualize the
ovaries and endometrium.

[Series 1: us pelvis complete · 0.20mm/px · 13 of 87 slices shown]
[im 1/87]
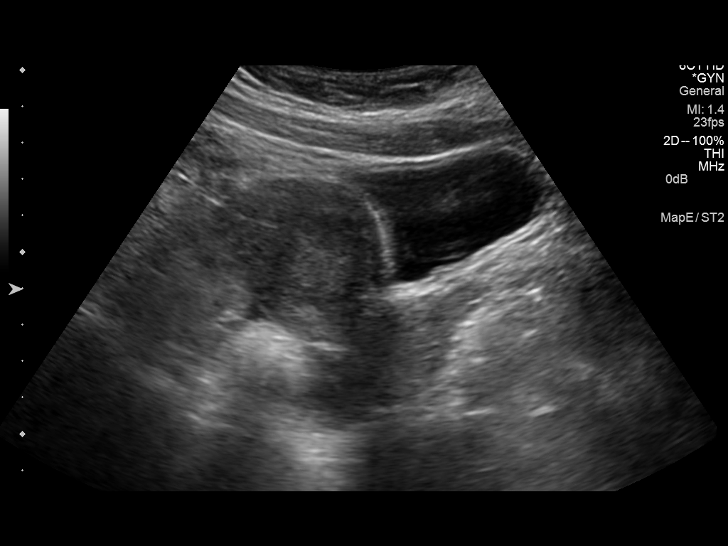
[im 8/87]
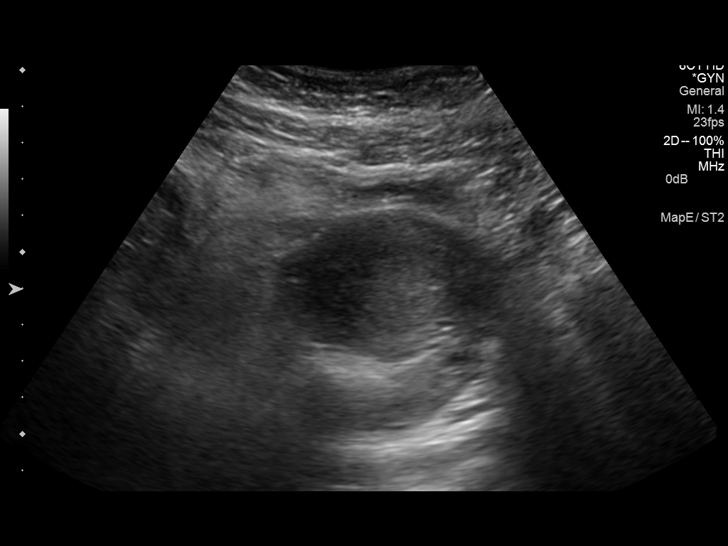
[im 15/87]
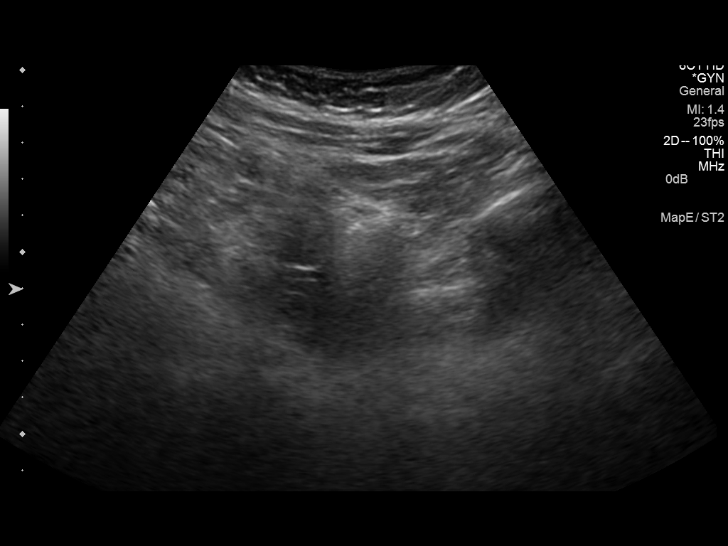
[im 22/87]
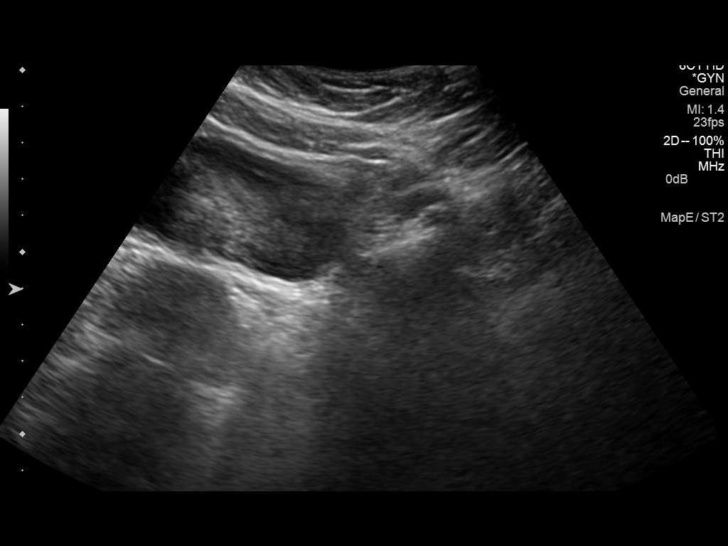
[im 29/87]
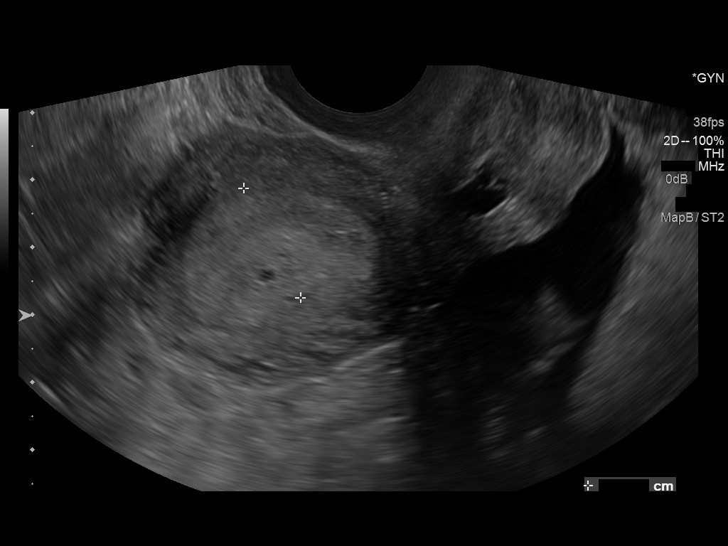
[im 36/87]
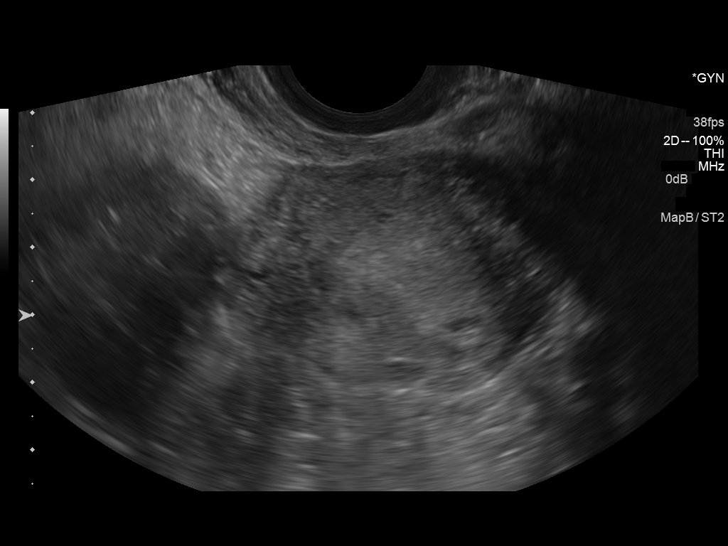
[im 44/87]
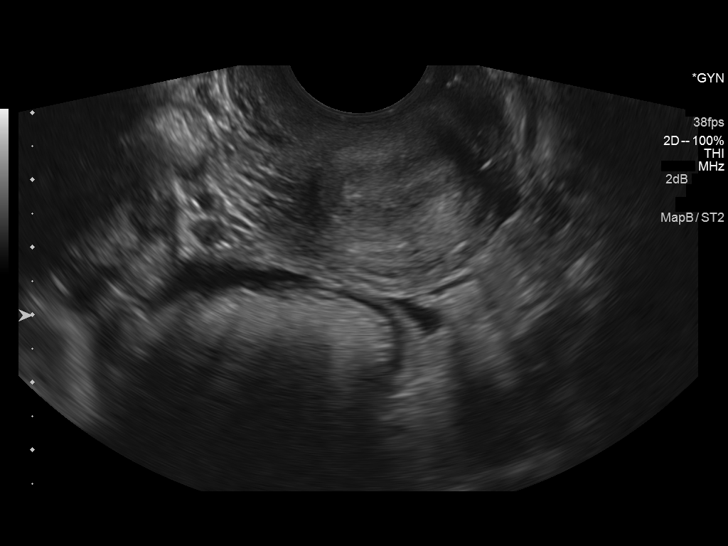
[im 51/87]
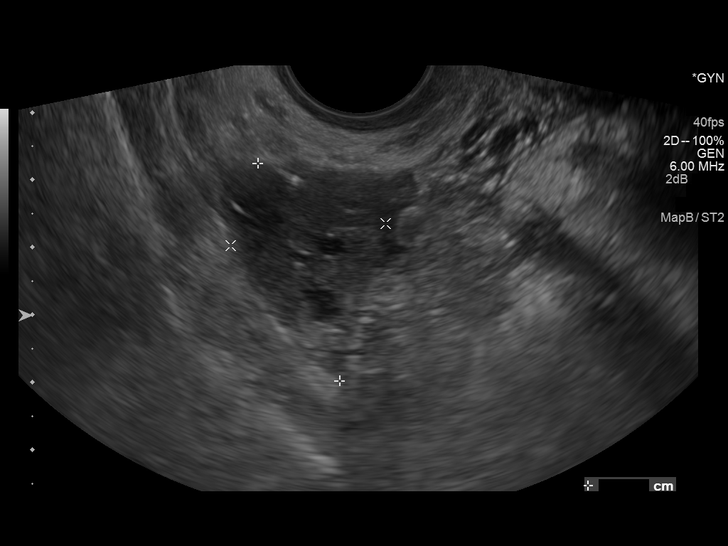
[im 58/87]
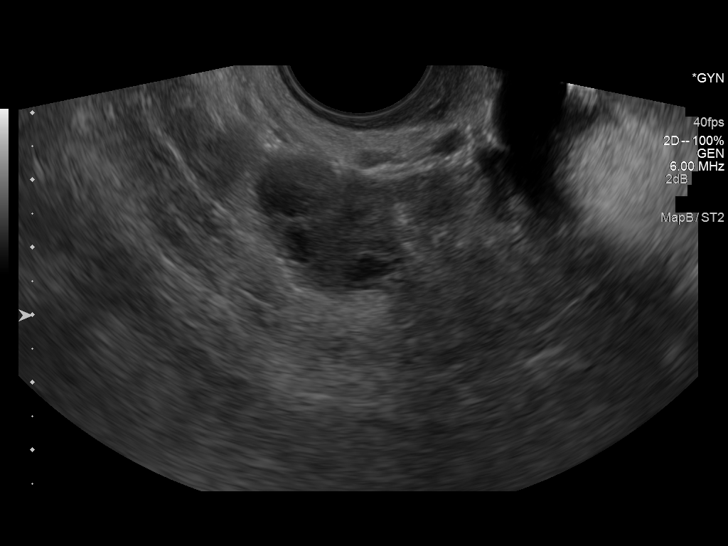
[im 65/87]
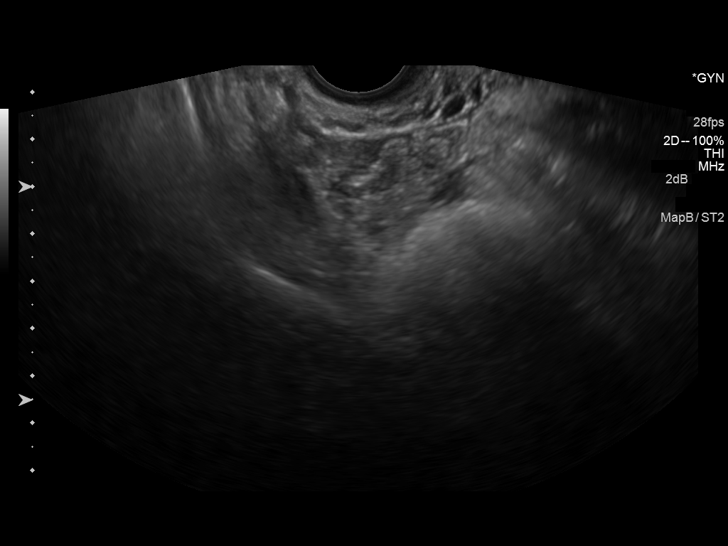
[im 72/87]
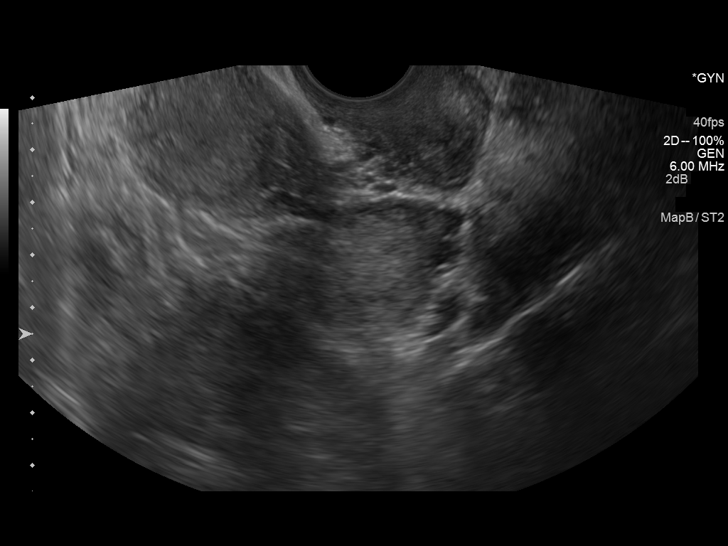
[im 79/87]
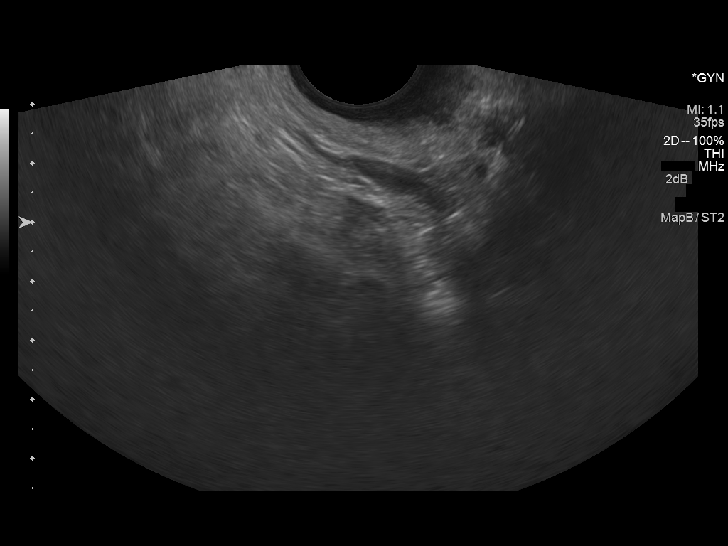
[im 87/87]
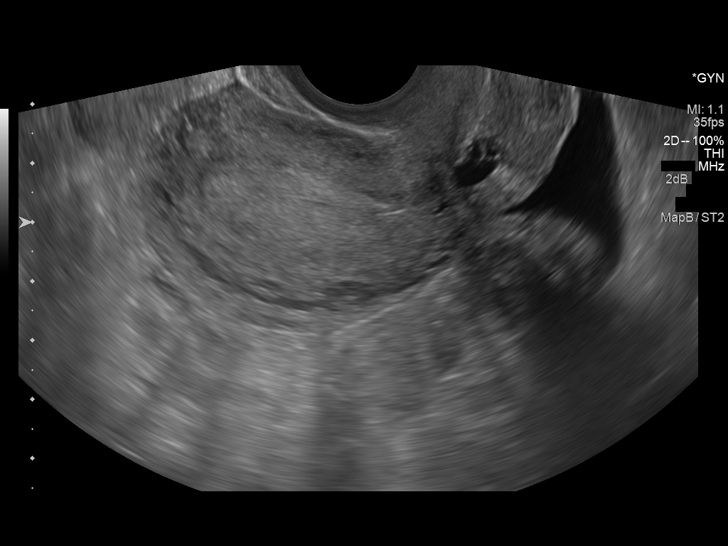

[13 of 25 positions shown; findings below may reference images not displayed]

FINDINGS: Uterus

Measurements: 7.4 x 4.3 x 5.1 cm. No fibroids or other mass
visualized.

Endometrium

Thickness: 18 mm..  No focal abnormality visualized.

Right ovary

Measurements: 3.4 x 2.3 x 2.2 cm.. Normal appearance/no adnexal
mass.

Left ovary

Measurements: 3.6 x 2 x 2.2 cm. Normal appearance/no adnexal mass.

Other findings

A small amount free pelvic fluid is noted and may be physiologic.
There is no evidence of adnexal mass.
IMPRESSION: Mildly thickened endometrium measuring 18 mm. Consider follow-up by
US in 6-8 weeks, during the week immediately following menses (exam
timing is critical).

Small amount of free pelvic fluid which may be physiologic.

No other significant abnormalities identified.

## 2017-06-18 ENCOUNTER — Encounter (HOSPITAL_COMMUNITY): Payer: Self-pay | Admitting: Family Medicine

## 2017-06-18 DIAGNOSIS — R05 Cough: Secondary | ICD-10-CM | POA: Diagnosis present

## 2017-06-18 DIAGNOSIS — Z79899 Other long term (current) drug therapy: Secondary | ICD-10-CM | POA: Diagnosis not present

## 2017-06-18 DIAGNOSIS — J069 Acute upper respiratory infection, unspecified: Secondary | ICD-10-CM | POA: Diagnosis not present

## 2017-06-18 NOTE — ED Triage Notes (Signed)
Patient is complaining of upper respiratory symptoms of a productive cough, nasal congestion and drainage, and sore throat when coughing. Denies fever. Patient reports she has tried OTC medication with no relief.

## 2017-06-19 ENCOUNTER — Emergency Department (HOSPITAL_COMMUNITY)
Admission: EM | Admit: 2017-06-19 | Discharge: 2017-06-19 | Disposition: A | Discharge status: Home / Self Care | Payer: 59 | Attending: Emergency Medicine | Admitting: Emergency Medicine

## 2017-06-19 ENCOUNTER — Emergency Department (HOSPITAL_COMMUNITY): Payer: 59

## 2017-06-19 DIAGNOSIS — J069 Acute upper respiratory infection, unspecified: Secondary | ICD-10-CM

## 2017-06-19 LAB — COMPREHENSIVE METABOLIC PANEL
ALBUMIN: 4.9 g/dL (ref 3.5–5.0)
ALK PHOS: 61 U/L (ref 38–126)
ALT: 35 U/L (ref 14–54)
ANION GAP: 10 (ref 5–15)
AST: 27 U/L (ref 15–41)
BILIRUBIN TOTAL: 1.4 mg/dL — AB (ref 0.3–1.2)
BUN: 7 mg/dL (ref 6–20)
CALCIUM: 9.7 mg/dL (ref 8.9–10.3)
CO2: 24 mmol/L (ref 22–32)
Chloride: 104 mmol/L (ref 101–111)
Creatinine, Ser: 0.59 mg/dL (ref 0.44–1.00)
GFR calc Af Amer: >60 mL/min (ref >60)
GFR calc non Af Amer: >60 mL/min (ref >60)
GLUCOSE: 101 mg/dL — AB (ref 65–99)
Potassium: 3.5 mmol/L (ref 3.5–5.1)
Sodium: 138 mmol/L (ref 135–145)
TOTAL PROTEIN: 8.9 g/dL — AB (ref 6.5–8.1)

## 2017-06-19 LAB — CBC WITH DIFFERENTIAL/PLATELET
BASOS ABS: 0 10*3/uL (ref 0.0–0.1)
BASOS PCT: 0 %
Eosinophils Absolute: 0.4 10*3/uL (ref 0.0–0.7)
Eosinophils Relative: 3 %
HCT: 47.9 % — ABNORMAL HIGH (ref 36.0–46.0)
Hemoglobin: 16.9 g/dL — ABNORMAL HIGH (ref 12.0–15.0)
Lymphocytes Relative: 9 %
Lymphs Abs: 1.1 10*3/uL (ref 0.7–4.0)
MCH: 30.5 pg (ref 26.0–34.0)
MCHC: 35.3 g/dL (ref 30.0–36.0)
MCV: 86.3 fL (ref 78.0–100.0)
Monocytes Absolute: 0.8 10*3/uL (ref 0.1–1.0)
Monocytes Relative: 6 %
Neutro Abs: 10.5 10*3/uL — ABNORMAL HIGH (ref 1.7–7.7)
Neutrophils Relative %: 82 %
Platelets: 299 10*3/uL (ref 150–400)
RBC: 5.55 MIL/uL — ABNORMAL HIGH (ref 3.87–5.11)
RDW: 12.5 % (ref 11.5–15.5)
WBC: 12.8 10*3/uL — ABNORMAL HIGH (ref 4.0–10.5)

## 2017-06-19 MED ORDER — KETOROLAC TROMETHAMINE 30 MG/ML IJ SOLN
30.0000 mg | Freq: Once | INTRAMUSCULAR | Status: AC
  Administered 2017-06-19: 30 mg via INTRAVENOUS
  Filled 2017-06-19: qty 1

## 2017-06-19 MED ORDER — AEROCHAMBER PLUS FLO-VU MEDIUM MISC
1.0000 | Freq: Once | Status: AC
  Administered 2017-06-19: 1
  Filled 2017-06-19: qty 1

## 2017-06-19 MED ORDER — PREDNISONE 10 MG (21) PO TBPK: ORAL_TABLET | ORAL | 0 refills | Status: AC

## 2017-06-19 MED ORDER — IBUPROFEN 600 MG PO TABS: 600.0000 mg | ORAL_TABLET | Freq: Four times a day (QID) | ORAL | 0 refills | Status: AC | PRN

## 2017-06-19 MED ORDER — IPRATROPIUM-ALBUTEROL 0.5-2.5 (3) MG/3ML IN SOLN
3.0000 mL | Freq: Once | RESPIRATORY_TRACT | Status: AC
  Administered 2017-06-19: 3 mL via RESPIRATORY_TRACT
  Filled 2017-06-19: qty 3

## 2017-06-19 MED ORDER — ALBUTEROL SULFATE HFA 108 (90 BASE) MCG/ACT IN AERS
1.0000 | INHALATION_SPRAY | RESPIRATORY_TRACT | Status: DC | PRN
  Administered 2017-06-19: 2 via RESPIRATORY_TRACT
  Filled 2017-06-19: qty 6.7

## 2017-06-19 MED ORDER — SODIUM CHLORIDE 0.9 % IV BOLUS (SEPSIS)
1000.0000 mL | Freq: Once | INTRAVENOUS | Status: AC
  Administered 2017-06-19: 1000 mL via INTRAVENOUS

## 2017-06-19 MED ORDER — DEXAMETHASONE SODIUM PHOSPHATE 10 MG/ML IJ SOLN
10.0000 mg | Freq: Once | INTRAMUSCULAR | Status: AC
  Administered 2017-06-19: 10 mg via INTRAVENOUS
  Filled 2017-06-19: qty 1

## 2017-06-19 MED ORDER — IBUPROFEN 200 MG PO TABS: 600.0000 mg | ORAL_TABLET | Freq: Once | ORAL | Status: DC

## 2017-06-19 NOTE — ED Provider Notes (Signed)
[facility]-EMERGENCY DEPT Provider Note   CSN: [id number] Arrival date & time: 06/18/17  2045     History   Chief Complaint Chief Complaint  Patient presents with  . URI    HPI Morgan DecreeKristle Schneider is a 27 y.o. female.  Pt presents to the ED today with cough and sinus congestion.  Sx have been going on since Monday the 7th.  The pt said she is not getting better.  The pt has been taking otc sinus meds without relief.      Past Medical History:  Diagnosis Date  . Anxiety     There are no active problems to display for this patient.   History reviewed. No pertinent surgical history.  OB History    Gravida Para Term Preterm AB Living   1             SAB TAB Ectopic Multiple Live Births                   Home Medications    Prior to Admission medications   Medication Sig Start Date End Date Taking? Authorizing Provider  doxycycline (VIBRAMYCIN) 100 MG capsule Take 1 capsule (100 mg total) by mouth 2 (two) times daily. 01/17/14   Mirian MoGentry, Matthew, MD  ibuprofen (ADVIL,MOTRIN) 600 MG tablet Take 1 tablet (600 mg total) by mouth every 6 (six) hours as needed. 06/19/17   Jacalyn LefevreHaviland, Rhealyn Cullen, MD  metroNIDAZOLE (FLAGYL) 500 MG tablet Take 1 tablet (500 mg total) by mouth 2 (two) times daily. 01/17/14   Mirian MoGentry, Matthew, MD  predniSONE (STERAPRED UNI-PAK 21 TAB) 10 MG (21) TBPK tablet Take 6 tabs by mouth daily  for 2 days, then 5 tabs for 2 days, then 4 tabs for 2 days, then 3 tabs for 2 days, 2 tabs for 2 days, then 1 tab by mouth daily for 2 days 06/19/17   Jacalyn LefevreHaviland, Cordia Miklos, MD  Pseudoeph-Doxylamine-DM-APAP (NYQUIL PO) Take 2 tablets by mouth 2 (two) times daily as needed (flu like symptoms).    [provider]    Family History History reviewed. No pertinent family history.  Social History Social History   Tobacco Use  . Smoking status: Never Smoker  . Smokeless tobacco: Never Used  Substance Use Topics  . Alcohol use: No  . Drug use: No      Allergies   Patient has no known allergies.   Review of Systems Review of Systems  Constitutional: Positive for fever.  Respiratory: Positive for cough, shortness of breath and wheezing.   All other systems reviewed and are negative.    Physical Exam Updated Vital Signs BP (!) 168/118   Pulse (!) 117   Temp 99.2 F (37.3 C) (Oral)   Resp 20   Ht 4\' 9"  (1.448 m)   Wt 59 kg (130 lb)   LMP 06/16/2017   SpO2 94%   BMI 28.13 kg/m   Physical Exam  Constitutional: She is oriented to person, place, and time. She appears well-developed and well-nourished.  HENT:  Head: Normocephalic and atraumatic.  Right Ear: External ear normal.  Left Ear: External ear normal.  Nose: Nose normal.  Mouth/Throat: Oropharynx is clear and moist.  Eyes: Conjunctivae and EOM are normal. Pupils are equal, round, and reactive to light.  Neck: Normal range of motion. Neck supple.  Cardiovascular: Normal heart sounds and intact distal pulses. Tachycardia present.  Pulmonary/Chest: She has wheezes.  Abdominal: Soft. Bowel sounds are normal.  Musculoskeletal: Normal range of motion.  Neurological: She is alert and oriented to person, place, and time.  Skin: Skin is warm and dry. Capillary refill takes less than 2 seconds.  Psychiatric: She has a normal mood and affect. Her behavior is normal. Judgment and thought content normal.  Nursing note and vitals reviewed.    ED Treatments / Results  Labs (all labs ordered are listed, but only abnormal results are displayed) Labs Reviewed  COMPREHENSIVE METABOLIC PANEL - Abnormal; Notable for the following components:      Result Value   Glucose, Bld 101 (*)    Total Protein 8.9 (*)    Total Bilirubin 1.4 (*)    All other components within normal limits  CBC WITH DIFFERENTIAL/PLATELET - Abnormal; Notable for the following components:   WBC 12.8 (*)    RBC 5.55 (*)    Hemoglobin 16.9 (*)    HCT 47.9 (*)    Neutro Abs 10.5 (*)    All other  components within normal limits    EKG  EKG Interpretation None       Radiology Dg Chest 2 View  Result Date: 06/19/2017 CLINICAL DATA:  Shortness of breath and cough EXAM: CHEST  2 VIEW COMPARISON:  Chest radiograph 04/28/2012 FINDINGS: The heart size and mediastinal contours are within normal limits. Both lungs are clear. The visualized skeletal structures are unremarkable. IMPRESSION: Normal chest radiograph. Electronically Signed   By: Deatra Robinson M.D.   On: 06/19/2017 01:15    Procedures Procedures (including critical care time)  Medications Ordered in ED Medications  albuterol (PROVENTIL HFA;VENTOLIN HFA) 108 (90 Base) MCG/ACT inhaler 1-2 puff (not administered)  AEROCHAMBER PLUS FLO-VU MEDIUM MISC 1 each (not administered)  sodium chloride 0.9 % bolus 1,000 mL (0 mLs Intravenous Stopped 06/19/17 0221)  ketorolac (TORADOL) 30 MG/ML injection 30 mg (30 mg Intravenous Given 06/19/17 0137)  dexamethasone (DECADRON) injection 10 mg (10 mg Intravenous Given 06/19/17 0137)  ipratropium-albuterol (DUONEB) 0.5-2.5 (3) MG/3ML nebulizer solution 3 mL (3 mLs Nebulization Given 06/19/17 0230)  sodium chloride 0.9 % bolus 1,000 mL (1,000 mLs Intravenous New Bag/Given 06/19/17 0223)     Initial Impression / Assessment and Plan / ED Course  I have reviewed the triage vital signs and the nursing notes.  Pertinent labs & imaging results that were available during my care of the patient were reviewed by me and considered in my medical decision making (see chart for details).    Pt is feeling much better.  She knows to return if worse and to f/u with pcp.  Final Clinical Impressions(s) / ED Diagnoses   Final diagnoses:  Viral upper respiratory tract infection    ED Discharge Orders        Ordered    ibuprofen (ADVIL,MOTRIN) 600 MG tablet  Every 6 hours PRN     06/19/17 0312    predniSONE (STERAPRED UNI-PAK 21 TAB) 10 MG (21) TBPK tablet     06/19/17 1610       Jacalyn Lefevre,  MD 06/19/17 [phone]
